# Patient Record
Sex: Female | Born: 1995 | Race: Black or African American | Hispanic: No | Marital: Single | Smoking: Never smoker
Health system: Southern US, Community
[De-identification: ages and names within clinical notes are randomized; demographics above are authoritative.]

## PROBLEM LIST (undated history)

## (undated) HISTORY — PX: TONSILLECTOMY: SUR1361

---

## 2018-03-27 ENCOUNTER — Ambulatory Visit (HOSPITAL_COMMUNITY)
Admission: EM | Admit: 2018-03-27 | Discharge: 2018-03-27 | Disposition: A | Discharge status: Home / Self Care | Payer: Federal, State, Local not specified - PPO

## 2018-03-27 ENCOUNTER — Encounter (HOSPITAL_COMMUNITY): Payer: Self-pay | Admitting: Emergency Medicine

## 2018-03-27 DIAGNOSIS — H1032 Unspecified acute conjunctivitis, left eye: Secondary | ICD-10-CM

## 2018-03-27 MED ORDER — CETIRIZINE HCL 10 MG PO TABS: 10.0000 mg | ORAL_TABLET | Freq: Every day | ORAL | 0 refills | Status: AC

## 2018-03-27 MED ORDER — OFLOXACIN 0.3 % OP SOLN: 1.0000 [drp] | Freq: Four times a day (QID) | OPHTHALMIC | 0 refills | Status: AC

## 2018-03-27 NOTE — ED Provider Notes (Signed)
[facility]    CSN: [id number] Arrival date & time: 03/27/18  1440     History   Chief Complaint Chief Complaint  Patient presents with  . Eye Problem    HPI Abigayl Loveless is a 22 y.o. female.   22 year old female comes in for 2-3 day history of left eye irritation, redness, drainage. Has had some blurry vision from eye watering, but improves with blinking. Denies photophobia. States recently got a dog, and has had some sneezing, rhinorrhea. Denies eye itching. Denies fever, chills, night sweats. Denies contact lens, glasses use. otc eye drops without relief.      History reviewed. No pertinent past medical history.  There are no active problems to display for this patient.   Past Surgical History:  Procedure Laterality Date  . TONSILLECTOMY      OB History   None      Home Medications    Prior to Admission medications   Medication Sig Start Date End Date Taking? Authorizing Provider  norethindrone-ethinyl estradiol-iron (MICROGESTIN FE,GILDESS FE,LOESTRIN FE) 1.5-30 MG-MCG tablet Take 1 tablet by mouth daily.   Yes [provider]  cetirizine (ZYRTEC) 10 MG tablet Take 1 tablet (10 mg total) by mouth daily. 03/27/18   Cathie Hoops, Annaliah Rivenbark V, PA-C  ofloxacin (OCUFLOX) 0.3 % ophthalmic solution Place 1 drop into the left eye 4 (four) times daily for 7 days. 03/27/18 04/03/18  Belinda Fisher, PA-C    Family History Family History  Problem Relation Age of Onset  . Healthy Mother   . Healthy Father     Social History Social History   Tobacco Use  . Smoking status: Never Smoker  Substance Use Topics  . Alcohol use: Yes  . Drug use: Yes     Allergies   Patient has no known allergies.   Review of Systems Review of Systems  Reason unable to perform ROS: See HPI as above.     Physical Exam Triage Vital Signs ED Triage Vitals [03/27/18 1639]  Enc Vitals Group     BP (!) 154/89     Pulse Rate 77     Resp 16     Temp 98.2 F (36.8 C)     Temp  src      SpO2 100 %     Weight      Height      Head Circumference      Peak Flow      Pain Score 6     Pain Loc      Pain Edu?      Excl. in GC?    No data found.  Updated Vital Signs BP (!) 154/89   Pulse 77   Temp 98.2 F (36.8 C)   Resp 16   SpO2 100%   Visual Acuity Right Eye Distance:   Left Eye Distance:   Bilateral Distance:    Right Eye Near: R Near: 20/20 Left Eye Near:  L Near: 20/25 Bilateral Near:  20/20  Physical Exam  Constitutional: She is oriented to person, place, and time. She appears well-developed and well-nourished. No distress.  HENT:  Head: Normocephalic and atraumatic.  Eyes: Pupils are equal, round, and reactive to light. EOM and lids are normal. Lids are everted and swept, no foreign bodies found. Right conjunctiva is not injected. Left conjunctiva is injected.  No ciliary injection.   Neurological: She is alert and oriented to person, place, and time.     UC Treatments /  Results  Labs (all labs ordered are listed, but only abnormal results are displayed) Labs Reviewed - No data to display  EKG None  Radiology No results found.  Procedures Procedures (including critical care time)  Medications Ordered in UC Medications - No data to display  Initial Impression / Assessment and Plan / UC Course  I have reviewed the triage vital signs and the nursing notes.  Pertinent labs & imaging results that were available during my care of the patient were reviewed by me and considered in my medical decision making (see chart for details).    Start ofloxacin drops as directed. Artificial tears gel as directed. Lid scrubs and warm compresses as directed. Patient to follow up with ophthalmology if symptoms worsens or does not improve. Return precautions given.   Final Clinical Impressions(s) / UC Diagnoses   Final diagnoses:  Acute conjunctivitis of left eye, unspecified acute conjunctivitis type   ED Prescriptions    Medication Sig  Dispense Auth. Provider   ofloxacin (OCUFLOX) 0.3 % ophthalmic solution Place 1 drop into the left eye 4 (four) times daily for 7 days. 5 mL Meryem Haertel V, PA-C   cetirizine (ZYRTEC) 10 MG tablet Take 1 tablet (10 mg total) by mouth daily. 30 tablet Threasa AlphaYu, Beverley Sherrard V, PA-C        Jorge Amparo V, [place]PA-C 03/27/18 1737

## 2018-03-27 NOTE — Discharge Instructions (Signed)
Use ofloxacin eyedrops as directed on left eye. Artificial tear gel/drop (systane, genteal). Lid scrubs and warm compresses as directed. Monitor for any worsening of symptoms, changes in vision, sensitivity to light, eye swelling, painful eye movement, follow up with ophthalmology for further evaluation.

## 2018-03-27 NOTE — ED Triage Notes (Signed)
Pt c/o L eye redness and drainage 

## 2018-07-08 ENCOUNTER — Emergency Department (HOSPITAL_COMMUNITY): Payer: Federal, State, Local not specified - PPO

## 2018-07-08 ENCOUNTER — Encounter (HOSPITAL_COMMUNITY): Payer: Self-pay | Admitting: Emergency Medicine

## 2018-07-08 ENCOUNTER — Ambulatory Visit (HOSPITAL_COMMUNITY)
Admission: EM | Admit: 2018-07-08 | Discharge: 2018-07-08 | Disposition: A | Discharge status: Home / Self Care | Payer: Federal, State, Local not specified - PPO | Source: Home / Self Care | Attending: Family Medicine | Admitting: Family Medicine

## 2018-07-08 ENCOUNTER — Emergency Department (HOSPITAL_COMMUNITY)
Admission: EM | Admit: 2018-07-08 | Discharge: 2018-07-08 | Disposition: A | Discharge status: Home / Self Care | Payer: Federal, State, Local not specified - PPO | Attending: Emergency Medicine | Admitting: Emergency Medicine

## 2018-07-08 ENCOUNTER — Other Ambulatory Visit: Payer: Self-pay

## 2018-07-08 DIAGNOSIS — R1084 Generalized abdominal pain: Secondary | ICD-10-CM

## 2018-07-08 DIAGNOSIS — R1030 Lower abdominal pain, unspecified: Secondary | ICD-10-CM | POA: Diagnosis not present

## 2018-07-08 DIAGNOSIS — N3000 Acute cystitis without hematuria: Secondary | ICD-10-CM | POA: Diagnosis not present

## 2018-07-08 DIAGNOSIS — R109 Unspecified abdominal pain: Secondary | ICD-10-CM | POA: Diagnosis present

## 2018-07-08 DIAGNOSIS — Z79899 Other long term (current) drug therapy: Secondary | ICD-10-CM | POA: Insufficient documentation

## 2018-07-08 LAB — CBC WITH DIFFERENTIAL/PLATELET
Abs Immature Granulocytes: 0.06 10*3/uL (ref 0.00–0.07)
Basophils Absolute: 0 10*3/uL (ref 0.0–0.1)
Basophils Relative: 0 %
Eosinophils Absolute: 0.2 10*3/uL (ref 0.0–0.5)
Eosinophils Relative: 1 %
HCT: 37.8 % (ref 36.0–46.0)
Hemoglobin: 12.1 g/dL (ref 12.0–15.0)
Immature Granulocytes: 0 %
Lymphocytes Relative: 19 %
Lymphs Abs: 2.7 10*3/uL (ref 0.7–4.0)
MCH: 28.2 pg (ref 26.0–34.0)
MCHC: 32 g/dL (ref 30.0–36.0)
MCV: 88.1 fL (ref 80.0–100.0)
MONOS PCT: 6 %
Monocytes Absolute: 0.8 10*3/uL (ref 0.1–1.0)
Neutro Abs: 10.5 10*3/uL — ABNORMAL HIGH (ref 1.7–7.7)
Neutrophils Relative %: 74 %
Platelets: 249 10*3/uL (ref 150–400)
RBC: 4.29 MIL/uL (ref 3.87–5.11)
RDW: 12.7 % (ref 11.5–15.5)
WBC: 14.3 10*3/uL — ABNORMAL HIGH (ref 4.0–10.5)
nRBC: 0 % (ref 0.0–0.2)

## 2018-07-08 LAB — URINALYSIS, ROUTINE W REFLEX MICROSCOPIC
BILIRUBIN URINE: NEGATIVE
GLUCOSE, UA: NEGATIVE mg/dL
Ketones, ur: 5 mg/dL — AB
Nitrite: NEGATIVE
Protein, ur: NEGATIVE mg/dL
SPECIFIC GRAVITY, URINE: 1.023 (ref 1.005–1.030)
pH: 6 (ref 5.0–8.0)

## 2018-07-08 LAB — COMPREHENSIVE METABOLIC PANEL
ALT: 11 U/L (ref 0–44)
AST: 18 U/L (ref 15–41)
Albumin: 3.3 g/dL — ABNORMAL LOW (ref 3.5–5.0)
Alkaline Phosphatase: 42 U/L (ref 38–126)
Anion gap: 9 (ref 5–15)
BILIRUBIN TOTAL: 0.6 mg/dL (ref 0.3–1.2)
BUN: 6 mg/dL (ref 6–20)
CO2: 23 mmol/L (ref 22–32)
Calcium: 9.2 mg/dL (ref 8.9–10.3)
Chloride: 105 mmol/L (ref 98–111)
Creatinine, Ser: 0.79 mg/dL (ref 0.44–1.00)
GFR calc Af Amer: >60 mL/min (ref >60)
GFR calc non Af Amer: >60 mL/min (ref >60)
Glucose, Bld: 81 mg/dL (ref 70–99)
POTASSIUM: 3.9 mmol/L (ref 3.5–5.1)
Sodium: 137 mmol/L (ref 135–145)
TOTAL PROTEIN: 6.7 g/dL (ref 6.5–8.1)

## 2018-07-08 LAB — POCT URINALYSIS DIP (DEVICE)
Glucose, UA: NEGATIVE mg/dL
Ketones, ur: 40 mg/dL — AB
Nitrite: NEGATIVE
Protein, ur: 30 mg/dL — AB
Specific Gravity, Urine: 1.02 (ref 1.005–1.030)
UROBILINOGEN UA: 0.2 mg/dL (ref 0.0–1.0)
pH: 6.5 (ref 5.0–8.0)

## 2018-07-08 LAB — LIPASE, BLOOD: Lipase: 20 U/L (ref 11–51)

## 2018-07-08 LAB — I-STAT BETA HCG BLOOD, ED (MC, WL, AP ONLY): I-stat hCG, quantitative: 6.3 m[IU]/mL — ABNORMAL HIGH (ref <5)

## 2018-07-08 LAB — POCT PREGNANCY, URINE: Preg Test, Ur: NEGATIVE

## 2018-07-08 MED ORDER — MORPHINE SULFATE (PF) 4 MG/ML IV SOLN
4.0000 mg | Freq: Once | INTRAVENOUS | Status: AC
  Administered 2018-07-08: 4 mg via INTRAVENOUS
  Filled 2018-07-08: qty 1

## 2018-07-08 MED ORDER — SODIUM CHLORIDE 0.9 % IV BOLUS
1000.0000 mL | Freq: Once | INTRAVENOUS | Status: AC
  Administered 2018-07-08: 1000 mL via INTRAVENOUS

## 2018-07-08 MED ORDER — IOHEXOL 300 MG/ML  SOLN
100.0000 mL | Freq: Once | INTRAMUSCULAR | Status: AC | PRN
  Administered 2018-07-08: 100 mL via INTRAVENOUS

## 2018-07-08 MED ORDER — NITROFURANTOIN MONOHYD MACRO 100 MG PO CAPS: 100.0000 mg | ORAL_CAPSULE | Freq: Two times a day (BID) | ORAL | 0 refills | Status: DC

## 2018-07-08 MED ORDER — ONDANSETRON HCL 4 MG/2ML IJ SOLN
4.0000 mg | Freq: Once | INTRAMUSCULAR | Status: AC
  Administered 2018-07-08: 4 mg via INTRAVENOUS
  Filled 2018-07-08: qty 2

## 2018-07-08 NOTE — ED Triage Notes (Addendum)
Pt states that she woke up yesterday with abdominal pain and cramping all over.  Pt states that now its just her lower abdomen and she is also having some lower back pain as well.  Pt does report that a few weeks ago she had cloudy urine and burning with urination.  She treated it with an OTC medication and she states the symptoms went away.

## 2018-07-08 NOTE — ED Notes (Signed)
Pt transported to CT ?

## 2018-07-08 NOTE — ED Provider Notes (Signed)
5:37 PM Handoff from [place] PA-C at shift change.   CT neg for acute process.   UA + UTI. Home rx macrobid.   The patient was urged to return to the Emergency Department immediately with worsening of current symptoms, worsening abdominal pain, persistent vomiting, blood noted in stools, fever, or any other concerns. The patient verbalized understanding. Mother updated on results as well per telephone.   BP (!) 141/65 (BP Location: Left Arm)   Pulse 90   Temp 98.5 F (36.9 C) (Oral)   Resp 18   LMP 06/23/2018 (Approximate)   SpO2 99%      Renne Crigler, PA-C 07/08/18 1738    Azalia Bilis, MD 07/08/18 2157

## 2018-07-08 NOTE — ED Provider Notes (Signed)
[facility] EMERGENCY DEPARTMENT Provider Note   CSN: [id number] Arrival date & time: 07/08/18  1338  History   Chief Complaint Chief Complaint  Patient presents with  . Abdominal Pain  . Emesis   HPI Leah Owen is a 23 y.o. female with no signficant past medical history who presents for evaluation of abdominal pain.  Patient states her abdominal pain began yesterday morning.  Pain is generalized in nature, however increased to her lower abdomen.  Had 2 episodes of nonbloody, nonbilious emesis yesterday. Last episode of emesis was 10 PM yesterday.  She has not had episodes of emesis today.  Rates her pain a 9/10.  Pain does not radiate.  Denies fever, chills, chest pain, shortness of breath, dysuria, diarrhea, pelvic pain, vaginal pain, vaginal discharge, irregular menstrual cycles.  LMP 06/23/2018.  Denies any concerns for STDs.  Patient states she was seen at urgent care earlier today and there was concern for appendicitis.  Denies previous abdominal surgeries.  Last PO intake 1000- Water. No solid food in >12 hours.  History obtained from patient.  No interpreter was used.     HPI  History reviewed. No pertinent past medical history.  There are no active problems to display for this patient.   Past Surgical History:  Procedure Laterality Date  . TONSILLECTOMY       OB History   No obstetric history on file.      Home Medications    Prior to Admission medications   Medication Sig Start Date End Date Taking? Authorizing Provider  cetirizine (ZYRTEC) 10 MG tablet Take 1 tablet (10 mg total) by mouth daily. Patient taking differently: Take 10 mg by mouth daily as needed for allergies.  03/27/18  Yes Yu, Amy V, PA-C  norethindrone-ethinyl estradiol-iron (MICROGESTIN FE,GILDESS FE,LOESTRIN FE) 1.5-30 MG-MCG tablet Take 1 tablet by mouth daily.   Yes [provider]    Family History Family History  Problem Relation Age of Onset  . Healthy  Mother   . Healthy Father     Social History Social History   Tobacco Use  . Smoking status: Never Smoker  . Smokeless tobacco: Never Used  Substance Use Topics  . Alcohol use: Yes  . Drug use: Yes    Types: Marijuana     Allergies   Patient has no known allergies.   Review of Systems Review of Systems  Constitutional: Negative.   HENT: Negative.   Eyes: Negative.   Respiratory: Negative.   Cardiovascular: Negative.   Gastrointestinal: Positive for abdominal pain, nausea and vomiting. Negative for anal bleeding, blood in stool, constipation and diarrhea.  Genitourinary: Negative.   Musculoskeletal: Negative.   Skin: Negative.   Neurological: Negative.   All other systems reviewed and are negative.    Physical Exam Updated Vital Signs BP (!) 141/65 (BP Location: Left Arm)   Pulse 90   Temp 98.5 F (36.9 C) (Oral)   Resp 18   LMP 06/23/2018 (Approximate)   SpO2 99%   Physical Exam Vitals signs and nursing note reviewed.  Constitutional:      General: She is not in acute distress.    Appearance: She is well-developed. She is not ill-appearing, toxic-appearing or diaphoretic.  HENT:     Head: Normocephalic and atraumatic.     Mouth/Throat:     Comments: Mucous membranes moist. Eyes:     Pupils: Pupils are equal, round, and reactive to light.  Neck:     Musculoskeletal: Normal range of  motion.  Cardiovascular:     Rate and Rhythm: Normal rate.     Heart sounds: Normal heart sounds. No murmur. No friction rub.  Pulmonary:     Effort: No respiratory distress.     Comments: Clear to auscultation by without wheeze, rhonchi or rales. No accessory muscle usage.  Able to speak in full sentences. Abdominal:     General: There is no distension.     Comments: Soft, without rebound or guarding.  No focal tenderness.  Negative McBurney, Murphy sign.  Negative psoas, obturator and Rovsing sign.  Normoactive bowel sounds.  No evidence of abdominal wall herniations.   Musculoskeletal: Normal range of motion.  Skin:    General: Skin is warm and dry.     Comments: No rashes or lesions.  Brisk capillary refill.  Neurological:     Mental Status: She is alert.     ED Treatments / Results  Labs (all labs ordered are listed, but only abnormal results are displayed) Labs Reviewed  CBC WITH DIFFERENTIAL/PLATELET - Abnormal; Notable for the following components:      Result Value   WBC 14.3 (*)    Neutro Abs 10.5 (*)    All other components within normal limits  COMPREHENSIVE METABOLIC PANEL - Abnormal; Notable for the following components:   Albumin 3.3 (*)    All other components within normal limits  I-STAT BETA HCG BLOOD, ED ([facility], [facility], [facility] ONLY) - Abnormal; Notable for the following components:   I-stat hCG, quantitative 6.3 (*)    All other components within normal limits  URINE CULTURE  LIPASE, BLOOD  URINALYSIS, ROUTINE W REFLEX MICROSCOPIC    EKG None  Radiology No results found.  Procedures Procedures (including critical care time)  Medications Ordered in ED Medications  morphine 4 MG/ML injection 4 mg (4 mg Intravenous Given 07/08/18 1416)  sodium chloride 0.9 % bolus 1,000 mL (1,000 mLs Intravenous New Bag/Given 07/08/18 1414)  ondansetron (ZOFRAN) injection 4 mg (4 mg Intravenous Given 07/08/18 1414)  iohexol (OMNIPAQUE) 300 MG/ML solution 100 mL (100 mLs Intravenous Contrast Given 07/08/18 1521)   Initial Impression / Assessment and Plan / ED Course  I have reviewed the triage vital signs and the nursing notes.  Pertinent labs & imaging results that were available during my care of the patient were reviewed by me and considered in my medical decision making (see chart for details).  23 year old female appears otherwise well presents for evaluation abdominal pain.  Afebrile, nonseptic, non-ill-appearing.  Seen by urgent care earlier today with concerns for early appendicitis and referred to the emergency department for evaluation.   Abdomen soft without rebound or guarding.  She has generalized tenderness.  No focal tenderness to right lower quadrant.  Negative Murphy sign, or any point.  Negative psoas, obturator, Rovsing sign.  His membranes moist.  Lungs clear to auscultation bilateral without wheeze, rhonchi or rales.  She has no vaginal discharge or vaginal pain.  Does not have any concerns for any STDs. No URI Sx. Will order labs, urine, imaging and reevaluate.  DDX: UTI/ Pyelonephritis, bowel obstruction/perforation, gallbladder pathology, ulcer, appendicitis, PID, gastroenteritis, ectopic pregnancy.  1444: hCG 6.3, patient had negative urine pregnant at urgent care as well as on Monday she got a new refill for her birth control pills.  Patient states she has not missed any pills and takes these faithfully.  Discussed possible chance of pregnancy.  Admits she is not pregnant.  States she will sign a paper saying that  we are not responsible if she is pregnant we get a CT scan.  1600: CT pending. On re-evaluation abdomen soft without rebound or guarding.  No peritoneal signs. Generalized tenderness however improved with pain medication.  CBC with mild leukocytosis of 14.3 metabolic panel negative for electrolyte, renal or liver abnormality lipase 20  Urinalysis, CT pending at care transfer.     Patient care transferred to [facility] who will determine ultimate treatment, plan and disposition. Final Clinical Impressions(s) / ED Diagnoses   Final diagnoses:  Generalized abdominal pain    ED Discharge Orders    None       Jermine Bibbee A, PA-C 07/08/18 1603    Azalia Bilis, MD 07/08/18 1609

## 2018-07-08 NOTE — ED Provider Notes (Signed)
[facility]   [id number] 07/08/18 Arrival Time: 1218  ASSESSMENT & PLAN:  1. Lower abdominal pain    Unclear etiology at this time with significant lower abdominal tenderness on exam. Question early appendicitis vs ovarian cyst. No urinary symptoms. UPT negative here.  Discharged to the ED for further evaluation/imaging. Declines transport. Stable upon discharge.  Follow-up Information    Go to  [facility] EMERGENCY DEPARTMENT.   Specialty:  Emergency Medicine Contact information: [address] [id number] [place] [address] [phone]         Reviewed expectations re: course of current medical issues. Questions answered. Outlined signs and symptoms indicating need for more acute intervention. Patient verbalized understanding. After Visit Summary given.   SUBJECTIVE: History from: patient. Leah Owen is a 23 y.o. female who presents with complaint of persistent lower abdominal discomfort. Initially generalized. Onset fairly abrupt upon waking, yesterday. Discomfort described as cramping/aching; without radiation. Symptoms are gradually worsening since beginning. Trouble sleeping last evening secondary to pain. Fever: questions chills; temp not taken. Aggravating factors: have not been identified. Alleviating factors: have not been identified. She denies belching, constipation, diarrhea, headache, hematuria, myalgias and sweats. Mild nausea. Reports emesis x 2-3 yesterday; none today. Appetite: absent. PO intake: decreased. Ambulatory without assistance. Urinary symptoms: none reported. Bowel movements: have not significantly changed; last bowel movement within the past 1-2 days and without blood. History of similar: no. OTC treatment: none reported.  Patient's last menstrual period was 06/23/2018 (approximate).   Past Surgical History:  Procedure Laterality Date  . TONSILLECTOMY     ROS: As per HPI. All other  systems negative.  OBJECTIVE:  Vitals:   07/08/18 1239  BP: 124/82  Pulse: 90  Resp: 12  Temp: 99.1 F (37.3 C)  TempSrc: Oral  SpO2: 99%    General appearance: alert and oriented; appears uncomfortable Oropharynx: moist Lungs: clear to auscultation bilaterally; unlabored respirations Heart: regular rate and rhythm Abdomen: soft; without distention; significant tenderness reported with palpation of lower abdomen; poorly localized; active but soft bowel sounds; without masses or organomegaly; without guarding or rebound tenderness Back: without CVA tenderness; FROM at waist Extremities: without LE edema; symmetrical; without gross deformities Skin: warm and dry Neurologic: normal gait Psychological: alert and cooperative; normal mood and affect  Labs: Results for orders placed or performed during the hospital encounter of 07/08/18  POCT urinalysis dip (device)  Result Value Ref Range   Glucose, UA NEGATIVE NEGATIVE mg/dL   Bilirubin Urine SMALL (A) NEGATIVE   Ketones, ur 40 (A) NEGATIVE mg/dL   Specific Gravity, Urine 1.020 1.005 - 1.030   Hgb urine dipstick TRACE (A) NEGATIVE   pH 6.5 5.0 - 8.0   Protein, ur 30 (A) NEGATIVE mg/dL   Urobilinogen, UA 0.2 0.0 - 1.0 mg/dL   Nitrite NEGATIVE NEGATIVE   Leukocytes,Ua SMALL (A) NEGATIVE  Pregnancy, urine POC  Result Value Ref Range   Preg Test, Ur NEGATIVE NEGATIVE    No Known Allergies                                             PMH: No chronic abdominal problems.  Social History   Socioeconomic History  . Marital status: Single    Spouse name: Not on file  . Number of children: Not on file  . Years of education: Not on file  .  Highest education level: Not on file  Occupational History  . Not on file  Social Needs  . Financial resource strain: Not on file  . Food insecurity:    Worry: Not on file    Inability: Not on file  . Transportation needs:    Medical: Not on file    Non-medical: Not on file  Tobacco  Use  . Smoking status: Never Smoker  . Smokeless tobacco: Never Used  Substance and Sexual Activity  . Alcohol use: Yes  . Drug use: Yes    Types: Marijuana  . Sexual activity: Not on file  Lifestyle  . Physical activity:    Days per week: Not on file    Minutes per session: Not on file  . Stress: Not on file  Relationships  . Social connections:    Talks on phone: Not on file    Gets together: Not on file    Attends religious service: Not on file    Active member of club or organization: Not on file    Attends meetings of clubs or organizations: Not on file    Relationship status: Not on file  . Intimate partner violence:    Fear of current or ex partner: Not on file    Emotionally abused: Not on file    Physically abused: Not on file    Forced sexual activity: Not on file  Other Topics Concern  . Not on file  Social History Narrative  . Not on file   Family History  Problem Relation Age of Onset  . Healthy Mother   . Healthy Father      Mardella Layman, MD 07/08/18 1341

## 2018-07-08 NOTE — ED Notes (Signed)
Patient transported to CT 

## 2018-07-08 NOTE — Discharge Instructions (Signed)
Please read and follow all provided instructions.  Your diagnoses today include:  1. Generalized abdominal pain   2. Acute cystitis without hematuria     Tests performed today include:  Urine test - suggests that you have an infection in your bladder  CT scan - does not show appendicitis or other problem  Vital signs. See below for your results today.   Medications prescribed:   Macrobid - antibiotic for urinary tract infection  You have been prescribed an antibiotic medicine: take the entire course of medicine even if you are feeling better. Stopping early can cause the antibiotic not to work.  Home care instructions:  Follow any educational materials contained in this packet.  Follow-up instructions: Please follow-up with your primary care provider in 3 days if symptoms are not resolved for further evaluation of your symptoms.  Return instructions:   Please return to the Emergency Department if you experience worsening symptoms.   Return with fever, worsening pain, persistent vomiting, worsening pain in your back.   Please return if you have any other emergent concerns.  Additional Information:  Your vital signs today were: BP (!) 141/65 (BP Location: Left Arm)    Pulse 90    Temp 98.5 F (36.9 C) (Oral)    Resp 18    LMP 06/23/2018 (Approximate)    SpO2 99%  If your blood pressure (BP) was elevated above 135/85 this visit, please have this repeated by your doctor within one month. --------------

## 2018-08-18 ENCOUNTER — Other Ambulatory Visit: Payer: Self-pay

## 2018-08-18 ENCOUNTER — Encounter (HOSPITAL_COMMUNITY): Payer: Self-pay | Admitting: Obstetrics and Gynecology

## 2018-08-18 ENCOUNTER — Emergency Department (HOSPITAL_COMMUNITY)
Admission: EM | Admit: 2018-08-18 | Discharge: 2018-08-18 | Disposition: A | Discharge status: Home / Self Care | Payer: Federal, State, Local not specified - PPO | Attending: Emergency Medicine | Admitting: Emergency Medicine

## 2018-08-18 DIAGNOSIS — R600 Localized edema: Secondary | ICD-10-CM | POA: Diagnosis not present

## 2018-08-18 DIAGNOSIS — Z79899 Other long term (current) drug therapy: Secondary | ICD-10-CM | POA: Insufficient documentation

## 2018-08-18 DIAGNOSIS — T39315A Adverse effect of propionic acid derivatives, initial encounter: Secondary | ICD-10-CM | POA: Diagnosis not present

## 2018-08-18 DIAGNOSIS — T7840XA Allergy, unspecified, initial encounter: Secondary | ICD-10-CM

## 2018-08-18 MED ORDER — METHYLPREDNISOLONE SODIUM SUCC 125 MG IJ SOLR
125.0000 mg | Freq: Once | INTRAMUSCULAR | Status: AC
  Administered 2018-08-18: 125 mg via INTRAVENOUS
  Filled 2018-08-18: qty 2

## 2018-08-18 MED ORDER — EPINEPHRINE 0.3 MG/0.3ML IJ SOAJ: 0.3000 mg | Freq: Once | INTRAMUSCULAR | 2 refills | Status: AC

## 2018-08-18 MED ORDER — FAMOTIDINE IN NACL 20-0.9 MG/50ML-% IV SOLN
20.0000 mg | Freq: Once | INTRAVENOUS | Status: AC
  Administered 2018-08-18: 20 mg via INTRAVENOUS
  Filled 2018-08-18: qty 50

## 2018-08-18 NOTE — Discharge Instructions (Addendum)
Follow-up with the allergist of your choice

## 2018-08-18 NOTE — ED Triage Notes (Signed)
Pt is coming from [place] restaurant. Patient walked in and had an allergic reactions, became hypertensive, diaphoretic and had some angioedema.  Pt was given 50mg  of benadryl IV and 0.3mg  of epi.  Pt started acting more baseline after the epi.  Pt is alert and oriented and less lethargic at this time.

## 2018-08-18 NOTE — ED Notes (Signed)
Bed: RESA Expected date:  Expected time:  Means of arrival:  Comments: EMS 23 yo female allergic reaction to ibuprofen 70/40 HR 120 Epi now BP 120/70 IV

## 2018-08-18 NOTE — ED Provider Notes (Signed)
[facility]-EMERGENCY DEPT Provider Note   CSN: [id number] Arrival date & time: 08/18/18  1944    History   Chief Complaint Chief Complaint  Patient presents with  . Allergic Reaction    HPI Leah Owen is a 23 y.o. female.     23 year old female presents after having allergic reaction to Motrin.  Patient states she developed tongue swelling as well as a diffuse rash with some pruritus.  No prior history of allergic reaction to Motrin.  Was taken in for menstrual cramps.  EMS was called and patient given epi 0.3 mg subcu along with 50 of Benadryl with great improvement of her symptoms.  Her tongue swelling has decreased.  Her dyspnea has improved.  She is feeling slightly sleepy at this time.  Only other substance used today was marijuana     No past medical history on file.  There are no active problems to display for this patient.   Past Surgical History:  Procedure Laterality Date  . TONSILLECTOMY       OB History   No obstetric history on file.      Home Medications    Prior to Admission medications   Medication Sig Start Date End Date Taking? Authorizing Provider  cetirizine (ZYRTEC) 10 MG tablet Take 1 tablet (10 mg total) by mouth daily. Patient taking differently: Take 10 mg by mouth daily as needed for allergies.  03/27/18   Cathie HoopsYu, Amy V, PA-C  nitrofurantoin, macrocrystal-monohydrate, (MACROBID) 100 MG capsule Take 1 capsule (100 mg total) by mouth 2 (two) times daily. 07/08/18   Renne CriglerGeiple, Joshua, PA-C  norethindrone-ethinyl estradiol-iron (MICROGESTIN FE,GILDESS FE,LOESTRIN FE) 1.5-30 MG-MCG tablet Take 1 tablet by mouth daily.    [provider]    Family History Family History  Problem Relation Age of Onset  . Healthy Mother   . Healthy Father     Social History Social History   Tobacco Use  . Smoking status: Never Smoker  . Smokeless tobacco: Never Used  Substance Use Topics  . Alcohol use: Yes  . Drug use: Yes   Types: Marijuana     Allergies   Pollen extract   Review of Systems Review of Systems  All other systems reviewed and are negative.    Physical Exam Updated Vital Signs There were no vitals taken for this visit.  Physical Exam Vitals signs and nursing note reviewed.  Constitutional:      General: She is not in acute distress.    Appearance: Normal appearance. She is well-developed. She is not toxic-appearing.  HENT:     Head: Normocephalic and atraumatic.     Mouth/Throat:     Mouth: No angioedema.  Eyes:     General: Lids are normal.     Conjunctiva/sclera: Conjunctivae normal.     Pupils: Pupils are equal, round, and reactive to light.  Neck:     Musculoskeletal: Normal range of motion and neck supple.     Thyroid: No thyroid mass.     Trachea: No tracheal deviation.  Cardiovascular:     Rate and Rhythm: Normal rate and regular rhythm.     Heart sounds: Normal heart sounds. No murmur. No gallop.   Pulmonary:     Effort: Pulmonary effort is normal. No respiratory distress.     Breath sounds: Normal breath sounds. No stridor. No decreased breath sounds, wheezing, rhonchi or rales.  Abdominal:     General: Bowel sounds are normal. There is no distension.  Palpations: Abdomen is soft.     Tenderness: There is no abdominal tenderness. There is no rebound.  Musculoskeletal: Normal range of motion.        General: No tenderness.  Skin:    General: Skin is warm and dry.     Findings: No abrasion or rash.  Neurological:     Mental Status: She is alert and oriented to person, place, and time.     GCS: GCS eye subscore is 4. GCS verbal subscore is 5. GCS motor subscore is 6.     Cranial Nerves: No cranial nerve deficit.     Sensory: No sensory deficit.  Psychiatric:        Speech: Speech normal.        Behavior: Behavior normal.      ED Treatments / Results  Labs (all labs ordered are listed, but only abnormal results are displayed) Labs Reviewed - No data  to display  EKG None  Radiology No results found.  Procedures Procedures (including critical care time)  Medications Ordered in ED Medications  methylPREDNISolone sodium succinate (SOLU-MEDROL) 125 mg/2 mL injection 125 mg (has no administration in time range)  famotidine (PEPCID) IVPB 20 mg premix (has no administration in time range)     Initial Impression / Assessment and Plan / ED Course  I have reviewed the triage vital signs and the nursing notes.  Pertinent labs & imaging results that were available during my care of the patient were reviewed by me and considered in my medical decision making (see chart for details).        Patient given Solu-Medrol and Pepcid here as she had already received Benadryl and epi prior to arrival.  Monitor here for 2 hours and is asymptomatic.  Her tongue is not edematous.  She has no stridor.  Will be discharged home with epinephrine pen and instructed to follow-up with the allergist of her choice   CRITICAL CARE Performed by: Toy Baker Total critical care time: 40 minutes Critical care time was exclusive of separately billable procedures and treating other patients. Critical care was necessary to treat or prevent imminent or life-threatening deterioration. Critical care was time spent personally by me on the following activities: development of treatment plan with patient and/or surrogate as well as nursing, discussions with consultants, evaluation of patient's response to treatment, examination of patient, obtaining history from patient or surrogate, ordering and performing treatments and interventions, ordering and review of laboratory studies, ordering and review of radiographic studies, pulse oximetry and re-evaluation of patient's condition.   Final Clinical Impressions(s) / ED Diagnoses   Final diagnoses:  None    ED Discharge Orders    None       Lorre Nick, MD 08/18/18 2136

## 2019-05-14 ENCOUNTER — Encounter (HOSPITAL_COMMUNITY): Payer: Self-pay

## 2019-05-14 ENCOUNTER — Other Ambulatory Visit: Payer: Self-pay

## 2019-05-14 ENCOUNTER — Ambulatory Visit (HOSPITAL_COMMUNITY)
Admission: EM | Admit: 2019-05-14 | Discharge: 2019-05-14 | Disposition: A | Discharge status: Home / Self Care | Payer: Federal, State, Local not specified - PPO | Attending: Internal Medicine | Admitting: Internal Medicine

## 2019-05-14 DIAGNOSIS — R030 Elevated blood-pressure reading, without diagnosis of hypertension: Secondary | ICD-10-CM | POA: Diagnosis not present

## 2019-05-14 NOTE — ED Provider Notes (Signed)
[facility]    CSN: [id number] Arrival date & time: 05/14/19  1428      History   Chief Complaint Chief Complaint  Patient presents with  . Hypertension    HPI Leah Owen is a 24 y.o. female with no past medical history comes to urgent care with complaints of elevated blood pressure noted when she went in for dentist's appointment.  Patient has no complaints.  She has been checking her blood pressures at home over the past couple of days and comes in to be evaluated.  Her blood pressures have been fluctuating over the past couple of days with some readings being abnormal and otherwise been normal..   HPI  History reviewed. No pertinent past medical history.  There are no problems to display for this patient.   Past Surgical History:  Procedure Laterality Date  . TONSILLECTOMY      OB History   No obstetric history on file.      Home Medications    Prior to Admission medications   Medication Sig Start Date End Date Taking? Authorizing Provider  cetirizine (ZYRTEC) 10 MG tablet Take 1 tablet (10 mg total) by mouth daily. Patient taking differently: Take 10 mg by mouth daily as needed for allergies.  03/27/18   Cathie Hoops, Amy V, PA-C  norethindrone-ethinyl estradiol-iron (MICROGESTIN FE,GILDESS FE,LOESTRIN FE) 1.5-30 MG-MCG tablet Take 1 tablet by mouth daily.    [provider]    Family History Family History  Problem Relation Age of Onset  . Healthy Mother   . Healthy Father     Social History Social History   Tobacco Use  . Smoking status: Never Smoker  . Smokeless tobacco: Never Used  Substance Use Topics  . Alcohol use: Yes  . Drug use: Yes    Types: Marijuana     Allergies   Pollen extract   Review of Systems Review of Systems  Constitutional: Negative.   Respiratory: Negative.   Cardiovascular: Negative.   Gastrointestinal: Negative.   Neurological: Negative.      Physical Exam Triage Vital Signs ED Triage Vitals    Enc Vitals Group     BP 05/14/19 1700 (!) 147/116     Pulse Rate 05/14/19 1700 62     Resp 05/14/19 1700 16     Temp 05/14/19 1700 98.2 F (36.8 C)     Temp Source 05/14/19 1700 Oral     SpO2 05/14/19 1700 100 %     Weight --      Height --      Head Circumference --      Peak Flow --      Pain Score 05/14/19 1708 0     Pain Loc --      Pain Edu? --      Excl. in GC? --    No data found.  Updated Vital Signs BP (!) 147/116 (BP Location: Left Arm)   Pulse 62   Temp 98.2 F (36.8 C) (Oral)   Resp 16   SpO2 100%   Visual Acuity Right Eye Distance:   Left Eye Distance:   Bilateral Distance:    Right Eye Near:   Left Eye Near:    Bilateral Near:     Physical Exam Vitals and nursing note reviewed.  Constitutional:      General: She is not in acute distress.    Appearance: She is not ill-appearing.  Cardiovascular:     Rate and Rhythm: Normal rate and regular  rhythm.     Pulses: Normal pulses.     Heart sounds: Normal heart sounds.  Pulmonary:     Effort: Pulmonary effort is normal.     Breath sounds: Normal breath sounds.  Abdominal:     General: Bowel sounds are normal.     Palpations: Abdomen is soft.  Neurological:     General: No focal deficit present.     Mental Status: She is alert and oriented to person, place, and time.     Cranial Nerves: No cranial nerve deficit.     Sensory: No sensory deficit.      UC Treatments / Results  Labs (all labs ordered are listed, but only abnormal results are displayed) Labs Reviewed - No data to display  EKG   Radiology No results found.  Procedures Procedures (including critical care time)  Medications Ordered in UC Medications - No data to display  Initial Impression / Assessment and Plan / UC Course  I have reviewed the triage vital signs and the nursing notes.  Pertinent labs & imaging results that were available during my care of the patient were reviewed by me and considered in my medical  decision making (see chart for details).     1.  Elevated blood pressure without history of hypertension: Patient is advised to monitor her blood pressure over the next few weeks.  If blood pressure remains elevated she may benefit from further work-up and some lifestyle changes management or medication depending on the severity of the blood pressure elevation.  Patient is advised to start exercising, decrease salt intake and try to lose some weight. Final Clinical Impressions(s) / UC Diagnoses   Final diagnoses:  Blood pressure elevated without history of HTN   Discharge Instructions   None    ED Prescriptions    None     PDMP not reviewed this encounter.   Chase Picket, MD 05/15/19 1515

## 2019-05-14 NOTE — ED Triage Notes (Signed)
Pt presents with elevated blood pressure since Thursday; pt has been monitoring it at home 2X a day

## 2021-01-18 IMAGING — CT CT ABDOMEN AND PELVIS WITH CONTRAST
2 of 4 series · 17 of 46 positions shown, 19 images · IV contrast (APPLIED)
Comparison: None.

CLINICAL DATA: Abdominal pain, vomiting

EXAM:
CT ABDOMEN AND PELVIS WITH CONTRAST
TECHNIQUE: Multidetector CT imaging of the abdomen and pelvis was performed
using the standard protocol following bolus administration of
intravenous contrast.
CONTRAST:  100mL OMNIPAQUE IOHEXOL 300 MG/ML  SOLN

[Series 3: abd/ pelvis 5.0 i30f 2 · axial · 0.75mm/px · z∈[+742,+1157]mm · 14 of 91 slices shown, 16 images]
[im 4/91  soft-tissue]
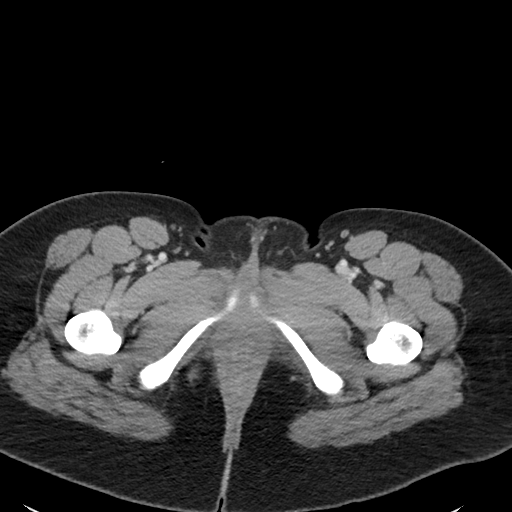
[im 4/91  bone]
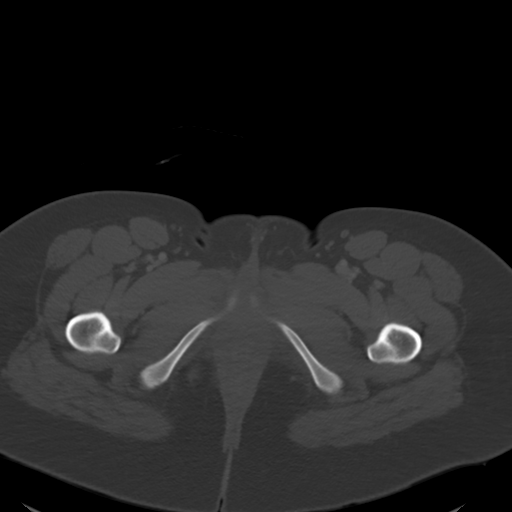
[im 11/91  soft-tissue]
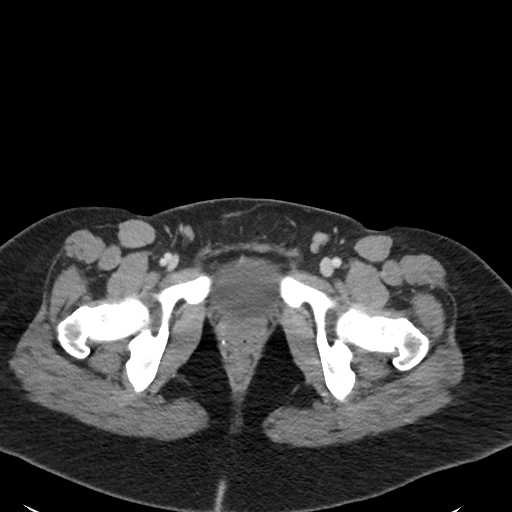
[im 19/91  soft-tissue]
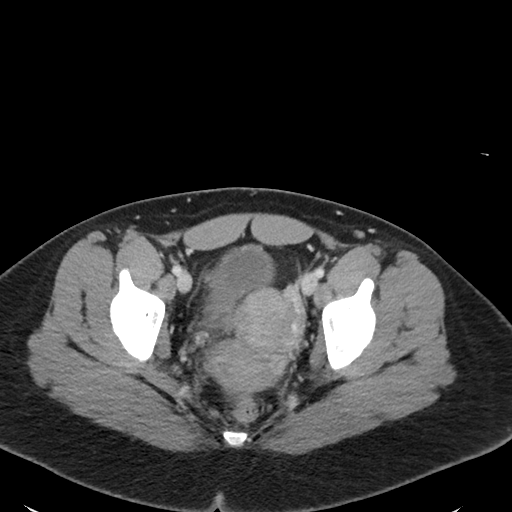
[im 26/91  soft-tissue]
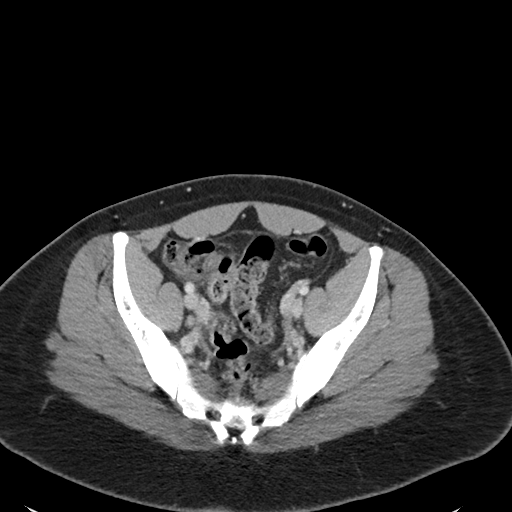
[im 29/91  soft-tissue]
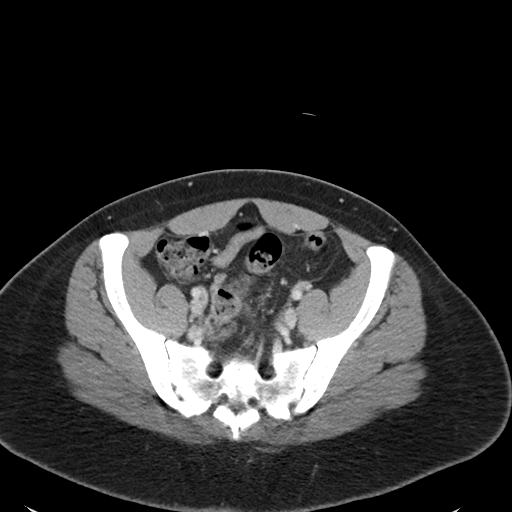
[im 37/91  soft-tissue]
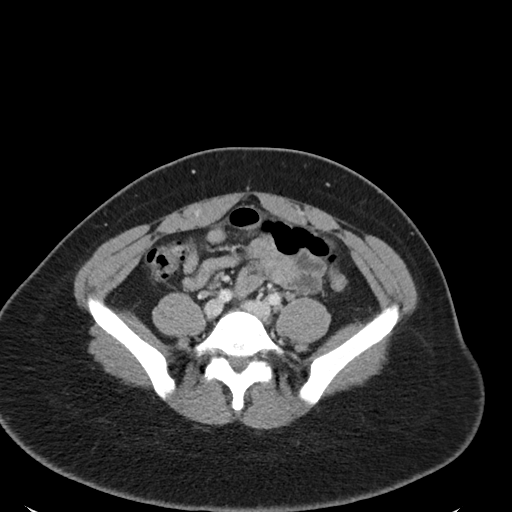
[im 44/91  soft-tissue]
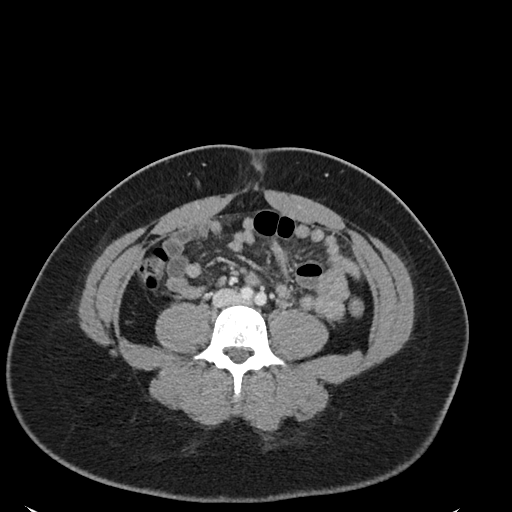
[im 47/91  soft-tissue]
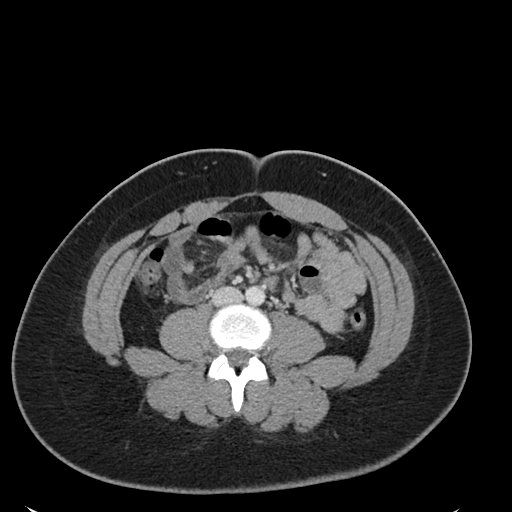
[im 55/91  soft-tissue]
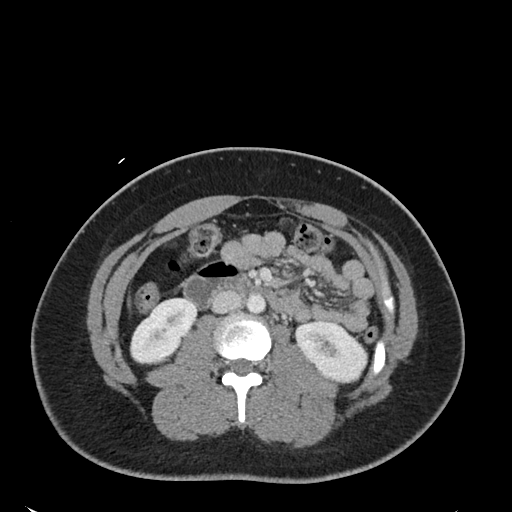
[im 55/91  bone]
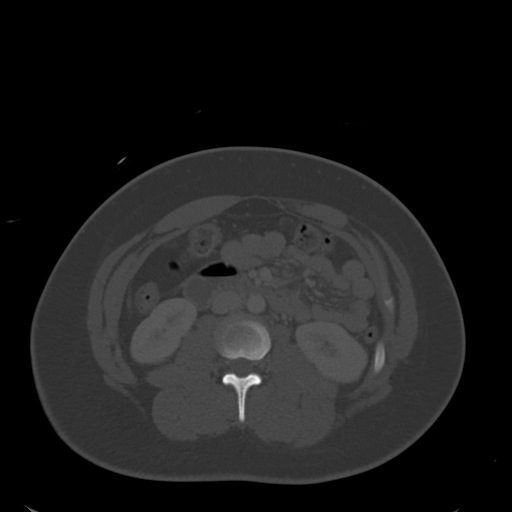
[im 62/91  soft-tissue]
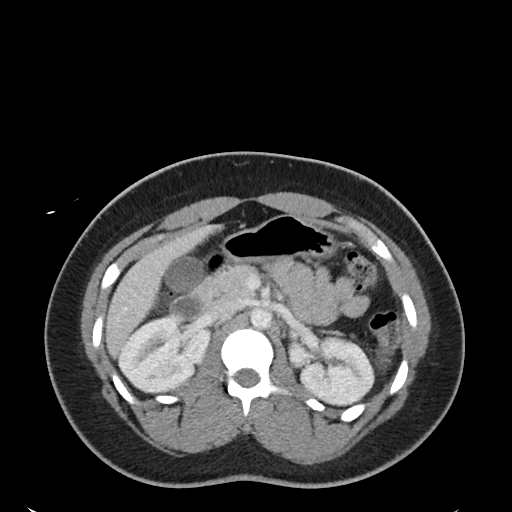
[im 69/91  soft-tissue]
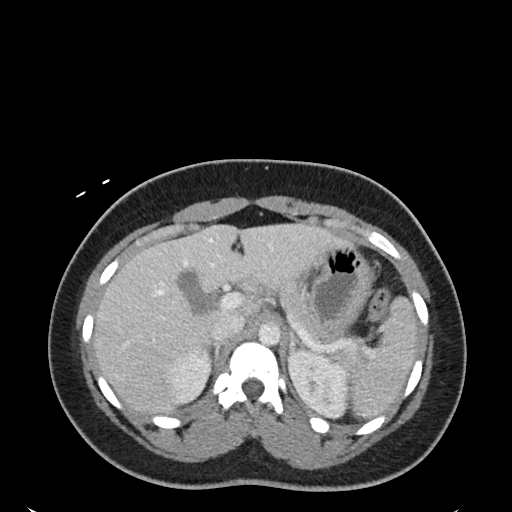
[im 73/91  soft-tissue]
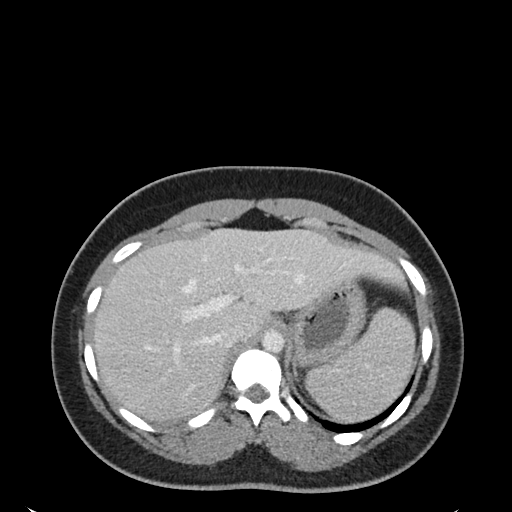
[im 80/91  soft-tissue]
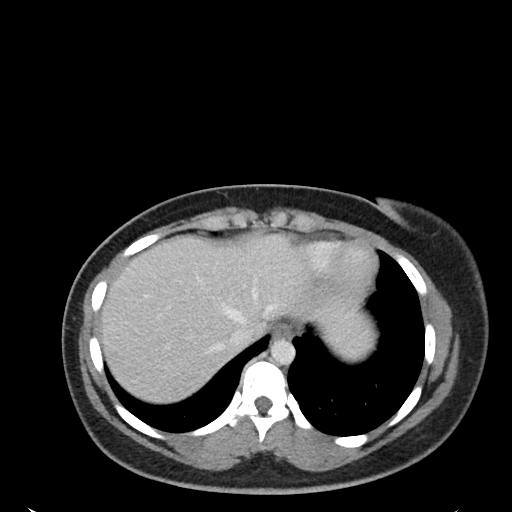
[im 87/91  soft-tissue]
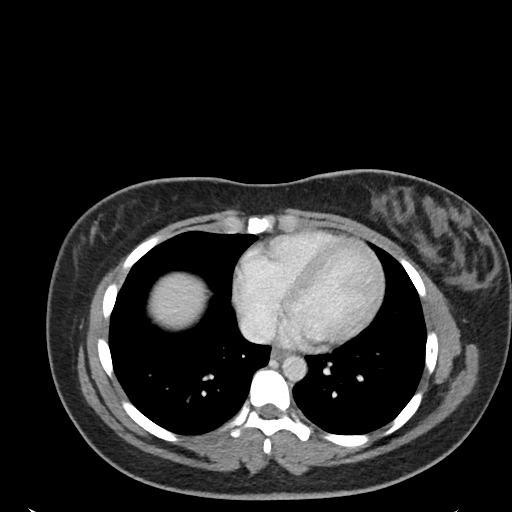

[Series 6: coronal soft tissue · coronal · 0.78mm/px · 3 of 82 slices shown]
[im 28/82  soft-tissue]
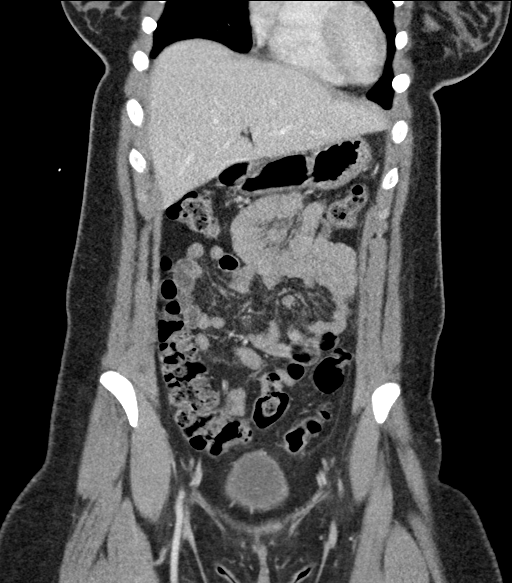
[im 37/82  soft-tissue]
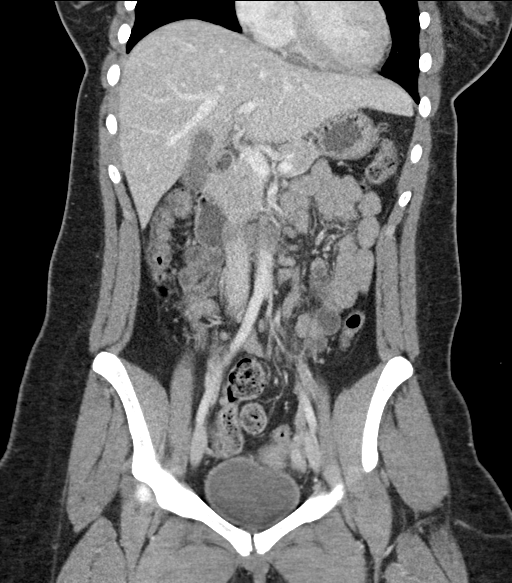
[im 46/82  soft-tissue]
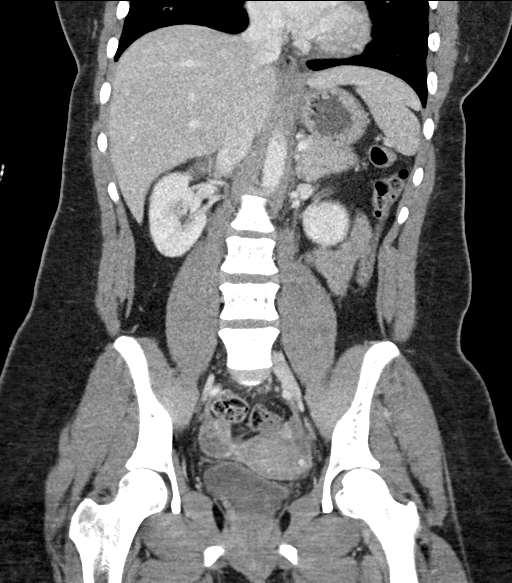

[17 of 46 positions shown; findings below may reference images not displayed]

FINDINGS: Lower chest: No acute abnormality.

Hepatobiliary: No focal liver abnormality is seen. No gallstones,
gallbladder wall thickening, or biliary dilatation.

Pancreas: Unremarkable. No pancreatic ductal dilatation or
surrounding inflammatory changes.

Spleen: Normal in size without focal abnormality.

Adrenals/Urinary Tract: Adrenal glands are unremarkable. Kidneys are
normal, without renal calculi, focal lesion, or hydronephrosis.
Bladder is unremarkable.

Stomach/Bowel: Stomach is within normal limits. Appendix appears
normal. No evidence of bowel wall thickening, distention, or
inflammatory changes.

Vascular/Lymphatic: No significant vascular findings are present. No
enlarged abdominal or pelvic lymph nodes.

Reproductive: Uterus and bilateral adnexa are unremarkable.

Other: Small fat containing umbilical hernia. No abdominopelvic
ascites.

Musculoskeletal: No acute or significant osseous findings.
IMPRESSION: 1. Normal appendix.
2. No acute abdominal or pelvic pathology.
